# Patient Record
Sex: Male | Born: 1990 | Race: White | Hispanic: No | Marital: Single | Smoking: Never smoker
Health system: Southern US, Community
[De-identification: ages and names within clinical notes are randomized; demographics above are authoritative.]

---

## 2011-09-27 ENCOUNTER — Emergency Department (HOSPITAL_COMMUNITY): Payer: Managed Care, Other (non HMO)

## 2011-09-27 ENCOUNTER — Encounter (HOSPITAL_COMMUNITY): Payer: Self-pay | Admitting: *Deleted

## 2011-09-27 ENCOUNTER — Emergency Department (HOSPITAL_COMMUNITY)
Admission: EM | Admit: 2011-09-27 | Discharge: 2011-09-27 | Disposition: A | Discharge status: Home / Self Care | Payer: Managed Care, Other (non HMO) | Attending: Emergency Medicine | Admitting: Emergency Medicine

## 2011-09-27 DIAGNOSIS — R0602 Shortness of breath: Secondary | ICD-10-CM | POA: Insufficient documentation

## 2011-09-27 DIAGNOSIS — R05 Cough: Secondary | ICD-10-CM | POA: Insufficient documentation

## 2011-09-27 DIAGNOSIS — R059 Cough, unspecified: Secondary | ICD-10-CM | POA: Insufficient documentation

## 2011-09-27 NOTE — ED Provider Notes (Signed)
History     CSN: [id number]  Arrival date & time 09/27/11  1214   None     Chief Complaint  Patient presents with  . Cough    (Consider location/radiation/quality/duration/timing/severity/associated sxs/prior treatment) HPI Comments: States he works in a [employer] and that the air if frequently "dirty and dusty".   Since yest he has felt SOB.  He has a mild NP cough.  No fever or chills.  Patient is a 21 y.o. male presenting with cough. The history is provided by the patient.  Cough This is a new problem. The current episode started yesterday. The problem occurs constantly. The problem has not changed since onset.The cough is non-productive. There has been no fever. Associated symptoms include shortness of breath. Pertinent negatives include no chest pain, no chills and no wheezing. He has tried nothing for the symptoms. He is not a smoker. His past medical history does not include bronchitis, pneumonia, bronchiectasis, COPD, emphysema or asthma.    History reviewed. No pertinent past medical history.  History reviewed. No pertinent past surgical history.  Family History  Problem Relation Age of Onset  . Diabetes Mother     History  Substance Use Topics  . Smoking status: Never Smoker   . Smokeless tobacco: Not on file  . Alcohol Use: Yes      Review of Systems  Constitutional: Negative for fever and chills.  Respiratory: Positive for cough and shortness of breath. Negative for chest tightness, wheezing and stridor.   Cardiovascular: Negative for chest pain.  Gastrointestinal: Negative for nausea and vomiting.  All other systems reviewed and are negative.    Allergies  Review of patient's allergies indicates no known allergies.  Home Medications  No current outpatient prescriptions on file.  BP 125/79  Pulse 80  Temp(Src) 97.8 F (36.6 C) (Oral)  Resp 20  Ht 6' (1.829 m)  Wt 195 lb (88.451 kg)  BMI 26.45 kg/m2  SpO2 100%  Physical Exam    Nursing note and vitals reviewed. Constitutional: He is oriented to person, place, and time. He appears well-developed and well-nourished.  HENT:  Head: Normocephalic and atraumatic.  Eyes: EOM are normal.  Neck: Normal range of motion.  Cardiovascular: Normal rate, regular rhythm, normal heart sounds and intact distal pulses.   Pulmonary/Chest: Effort normal and breath sounds normal. No accessory muscle usage. Not tachypneic. No respiratory distress. He has no decreased breath sounds. He has no wheezes. He has no rhonchi. He has no rales. He exhibits no tenderness.  Abdominal: Soft. He exhibits no distension. There is no tenderness.  Musculoskeletal: Normal range of motion.  Neurological: He is alert and oriented to person, place, and time.  Skin: Skin is warm and dry.  Psychiatric: He has a normal mood and affect. Judgment normal.    ED Course  Procedures (including critical care time)  Labs Reviewed - No data to display Dg Chest 2 View  09/27/2011  *RADIOLOGY REPORT*  Clinical Data: Cough, short of breath  CHEST - 2 VIEW  Comparison: None available  Findings: Normal mediastinum and cardiac silhouette.  Normal pulmonary  vasculature.  No evidence of effusion, infiltrate, or pneumothorax.  No acute bony abnormality.  IMPRESSION: No acute cardiopulmonary process.  Original Report Authenticated By: Genevive Bi, M.D.     1. Shortness of breath       MDM  Normal CXR.  SO2 100%.  Wear mask at work.  Return if sxs worsen or change significantly.  Worthy Rancher, PA 09/27/11 1425

## 2011-09-27 NOTE — ED Provider Notes (Signed)
Medical screening examination/treatment/procedure(s) were performed by non-physician practitioner and as supervising physician I was immediately available for consultation/collaboration. Devoria Albe, MD, Armando Gang   Ward Givens, MD 09/27/11 [phone]

## 2011-09-27 NOTE — Discharge Instructions (Signed)
Shortness of Breath Shortness of breath (dyspnea) is the feeling of uneasy breathing. Shortness of breath does not always mean that there is a life-threatening illness. However, shortness of breath requires immediate medical care. CAUSES  Causes for shortness of breath include:  Not enough oxygen in the air (as with high altitudes or with a smoke-filled room).   Short-term (acute) lung disease, including:   Infections such as pneumonia.   Fluid in the lungs, such as heart failure.   A blood clot in the lungs (pulmonary embolism).   Lasting (chronic) lung diseases.   Heart disease (heart attack, angina, heart failure, and others).   Low red blood cells (anemia).   Poor physical fitness. This can cause shortness of breath when you exercise.   Chest or back injuries or stiffness.   Being overweight (obese).   Anxiety. This can make you feel like you are not getting enough air.  DIAGNOSIS  Serious medical problems can usually be found during your physical exam. Many tests may also be done to determine why you are having shortness of breath. Tests include:  Chest X-rays.   Lung function tests.   Blood tests.   Electrocardiography.   Exercise testing.   A cardiac echo.   Imaging scans.  Your caregiver may not be able to find a cause for your shortness of breath after your exam. In this case, it is important to have a follow-up exam with your caregiver as directed.  HOME CARE INSTRUCTIONS   Do not smoke. Smoking is a common cause of shortness of breath. Ask for help to stop smoking.   Avoid being around chemicals that may bother your breathing (paint fumes, dust).   Rest as needed. Slowly resume your usual activities.   If medicines were prescribed, take them as directed for the full length of time directed. This includes oxygen and any inhaled medicines.   Follow up with your caregiver as directed. Waiting to do so or failure to follow up could result in worsening of  your condition and possible disability or death.   Be sure you understand what to do or who to call if your shortness of breath worsens.  SEEK MEDICAL CARE IF:   Your condition does not improve in the time expected.   You have a hard time doing your normal activities even with rest.   You have any side effects or problems with the medicines prescribed.   You develop any new symptoms.  SEEK IMMEDIATE MEDICAL CARE IF:   Your shortness of breath is getting worse.   You feel lightheaded, faint, or develop a cough not controlled with medicines.   You start coughing up blood.   You have pain with breathing.   You have chest pain or pain in your arms, shoulders, or abdomen.   You have a fever.   You are unable to walk up stairs or exercise the way you normally do.   Your symptoms are getting worse.  Document Released: 02/02/2001 Document Revised: 04/29/2011 Document Reviewed: 09/20/2007 [name] Patient Information 2012 [place], [place].   The chest x-ray is normal.  Your vitals reveal normal lung function.  Return to the ED if your symptoms worsen or change significantly.

## 2011-09-27 NOTE — ED Notes (Signed)
Coughing for over 1 year, now feels he can only take "half a breath".  Non productive cough, No fever.

## 2011-09-27 NOTE — ED Notes (Signed)
Pt DC to home with steady gait 

## 2020-02-20 ENCOUNTER — Other Ambulatory Visit: Payer: Self-pay

## 2020-02-20 ENCOUNTER — Emergency Department (HOSPITAL_COMMUNITY)
Admission: EM | Admit: 2020-02-20 | Discharge: 2020-02-20 | Disposition: A | Discharge status: Home / Self Care | Payer: PRIVATE HEALTH INSURANCE | Attending: Emergency Medicine | Admitting: Emergency Medicine

## 2020-02-20 ENCOUNTER — Emergency Department (HOSPITAL_COMMUNITY): Payer: PRIVATE HEALTH INSURANCE

## 2020-02-20 ENCOUNTER — Encounter (HOSPITAL_COMMUNITY): Payer: Self-pay

## 2020-02-20 DIAGNOSIS — W230XXA Caught, crushed, jammed, or pinched between moving objects, initial encounter: Secondary | ICD-10-CM | POA: Insufficient documentation

## 2020-02-20 DIAGNOSIS — Y990 Civilian activity done for income or pay: Secondary | ICD-10-CM | POA: Insufficient documentation

## 2020-02-20 DIAGNOSIS — S61212A Laceration without foreign body of right middle finger without damage to nail, initial encounter: Secondary | ICD-10-CM | POA: Insufficient documentation

## 2020-02-20 DIAGNOSIS — Y9269 Other specified industrial and construction area as the place of occurrence of the external cause: Secondary | ICD-10-CM | POA: Insufficient documentation

## 2020-02-20 DIAGNOSIS — Y93H9 Activity, other involving exterior property and land maintenance, building and construction: Secondary | ICD-10-CM | POA: Diagnosis not present

## 2020-02-20 MED ORDER — BUPIVACAINE HCL (PF) 0.5 % IJ SOLN
10.0000 mL | Freq: Once | INTRAMUSCULAR | Status: AC
  Administered 2020-02-20: 10 mL
  Filled 2020-02-20: qty 10

## 2020-02-20 NOTE — ED Triage Notes (Signed)
Patient here with right hand middle finger laceration to tip of finger after slicing on metal, nailbed intact.

## 2020-02-20 NOTE — ED Provider Notes (Signed)
[name] EMERGENCY DEPARTMENT Provider Note   CSN: [id number] Arrival date & time: 02/20/20  1035     History No chief complaint on file.   James Huerta is a 29 y.o. male.  Patient arrives from work after suffering injury to right hand. Patient reports that his right middle finger was crushed between two metal plates, resulting in laceration to the distal tip of the finger. Nail bed appears intact.  The history is provided by the patient. No language interpreter was used.  Laceration Location:  Finger Finger laceration location:  R middle finger Quality: avulsion   Bleeding: controlled   Injury mechanism: crush injury. Pain details:    Quality:  Throbbing   Severity:  Mild Foreign body present:  No foreign bodies Tetanus status:  Up to date      History reviewed. No pertinent past medical history.  There are no problems to display for this patient.   History reviewed. No pertinent surgical history.     Family History  Problem Relation Age of Onset  . Diabetes Mother     Social History   Tobacco Use  . Smoking status: Never Smoker  Substance Use Topics  . Alcohol use: Yes  . Drug use: No    Home Medications Prior to Admission medications   Not on File    Allergies    Patient has no known allergies.  Review of Systems   Review of Systems  Skin: Positive for wound.  All other systems reviewed and are negative.   Physical Exam Updated Vital Signs BP 128/85 (BP Location: Left Arm)   Pulse 76   Temp 98.2 F (36.8 C) (Oral)   Resp 16   Ht 6' (1.829 m)   Wt 83.9 kg   SpO2 100%   BMI 25.09 kg/m   Physical Exam Vitals and nursing note reviewed.  Constitutional:      Appearance: He is well-developed.  HENT:     Head: Normocephalic and atraumatic.     Nose: Nose normal.     Mouth/Throat:     Mouth: Mucous membranes are moist.  Eyes:     Conjunctiva/sclera: Conjunctivae normal.  Cardiovascular:     Rate and  Rhythm: Normal rate and regular rhythm.     Heart sounds: No murmur heard.   Pulmonary:     Effort: Pulmonary effort is normal. No respiratory distress.     Breath sounds: Normal breath sounds.  Abdominal:     Palpations: Abdomen is soft.     Tenderness: There is no abdominal tenderness.  Musculoskeletal:     Cervical back: Neck supple.  Skin:    General: Skin is warm and dry.  Neurological:     Mental Status: He is alert and oriented to person, place, and time.  Psychiatric:        Mood and Affect: Mood normal.     ED Results / Procedures / Treatments   Labs (all labs ordered are listed, but only abnormal results are displayed) Labs Reviewed - No data to display  EKG None  Radiology DG Finger Middle Right  Result Date: 02/20/2020 CLINICAL DATA:  Crush injury to the third digit EXAM: RIGHT MIDDLE FINGER 2+V COMPARISON:  None. FINDINGS: Soft tissue defect is noted consistent with the recent injury. No underlying fracture is seen. No foreign body is noted. IMPRESSION: Soft tissue defect without acute bony abnormality. Electronically Signed   By: Alcide Clever M.D.   On: 02/20/2020 13:30  Procedures .Marland KitchenLaceration Repair  Date/Time: 02/20/2020 4:16 PM Performed by: Felicie Morn, NP Authorized by: Felicie Morn, NP   Consent:    Consent obtained:  Verbal   Consent given by:  Patient   Risks discussed:  Infection, pain and poor cosmetic result Anesthesia (see MAR for exact dosages):    Anesthesia method:  Local infiltration   Local anesthetic:  Bupivacaine 0.5% w/o epi Laceration details:    Location:  Finger   Finger location:  R long finger   Length (cm):  1.2 Repair type:    Repair type:  Simple Pre-procedure details:    Preparation:  Patient was prepped and draped in usual sterile fashion Exploration:    Wound exploration: entire depth of wound probed and visualized     Contaminated: no   Treatment:    Area cleansed with:  Betadine and saline   Amount of  cleaning:  Extensive   Irrigation solution:  Sterile saline   Irrigation method:  Syringe Skin repair:    Repair method:  Sutures   Suture size:  5-0   Suture material:  Fast-absorbing gut   Suture technique:  Simple interrupted   Number of sutures:  7 Post-procedure details:    Dressing:  Sterile dressing   Patient tolerance of procedure:  Tolerated well, no immediate complications   (including critical care time)  Medications Ordered in ED Medications  bupivacaine (MARCAINE) 0.5 % injection 10 mL (10 mLs Infiltration Given by Other 02/20/20 1421)    ED Course  I have reviewed the triage vital signs and the nursing notes.  Pertinent labs & imaging results that were available during my care of the patient were reviewed by me and considered in my medical decision making (see chart for details).    MDM Rules/Calculators/A&P                          Tetanus {UTD. Laceration occurred < 12 hours prior to repair. Discussed laceration care with pt and answered questions. Wound closed with absorbable sutures. Patient to follow-up should there be signs of dehiscence or infection. Pt is hemodynamically stable with no complaints prior to dc.   Final Clinical Impression(s) / ED Diagnoses Final diagnoses:  Laceration of right middle finger without foreign body without damage to nail, initial encounter    Rx / DC Orders ED Discharge Orders    None       Felicie Morn, NP 02/20/20 2022    Cathren Laine, MD 02/21/20 1623

## 2020-02-20 NOTE — Discharge Instructions (Signed)
Your wound was closed with absorbable sutures. They will dissolve on their own. Please keep wound clean and dry. Monitor for signs of infection. You may follow-up with your primary care provider as needed.

## 2020-12-16 ENCOUNTER — Other Ambulatory Visit (HOSPITAL_COMMUNITY): Payer: Self-pay | Admitting: Family Medicine

## 2020-12-16 DIAGNOSIS — M542 Cervicalgia: Secondary | ICD-10-CM

## 2020-12-16 DIAGNOSIS — R599 Enlarged lymph nodes, unspecified: Secondary | ICD-10-CM

## 2020-12-23 ENCOUNTER — Other Ambulatory Visit: Payer: Self-pay

## 2020-12-23 ENCOUNTER — Ambulatory Visit (HOSPITAL_COMMUNITY)
Admission: RE | Admit: 2020-12-23 | Discharge: 2020-12-23 | Disposition: A | Discharge status: Home / Self Care | Payer: No Typology Code available for payment source | Source: Ambulatory Visit | Attending: Family Medicine | Admitting: Family Medicine

## 2020-12-23 DIAGNOSIS — M542 Cervicalgia: Secondary | ICD-10-CM | POA: Insufficient documentation

## 2020-12-23 DIAGNOSIS — R599 Enlarged lymph nodes, unspecified: Secondary | ICD-10-CM | POA: Insufficient documentation

## 2021-09-16 IMAGING — US US SOFT TISSUE HEAD/NECK
1 series · 14 of 18 positions shown · non-contrast
Comparison: None.

CLINICAL DATA: Left neck pain, palpable lymph nodes

EXAM:
ULTRASOUND OF HEAD/NECK SOFT TISSUES
TECHNIQUE: Ultrasound examination of the head and neck soft tissues was
performed in the area of clinical concern.

[Series 1: us soft tissue head/neck · 0.06mm/px · 18 acquisitions, 14 frames shown]
[im 1/18]
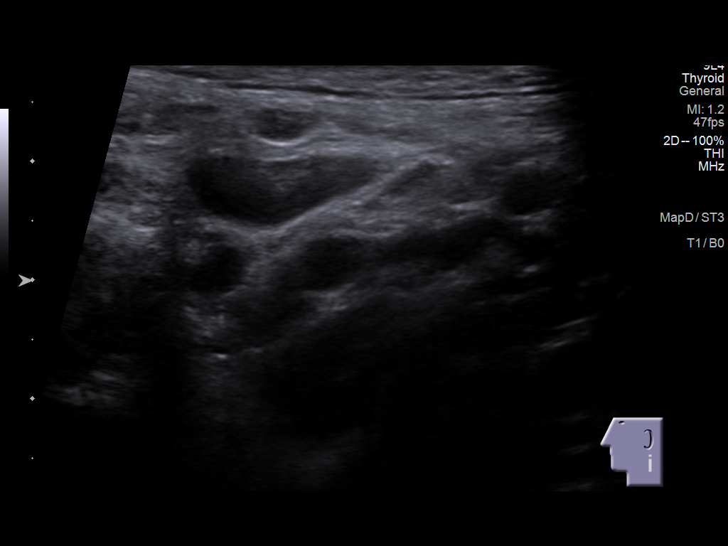
[im 2/18]
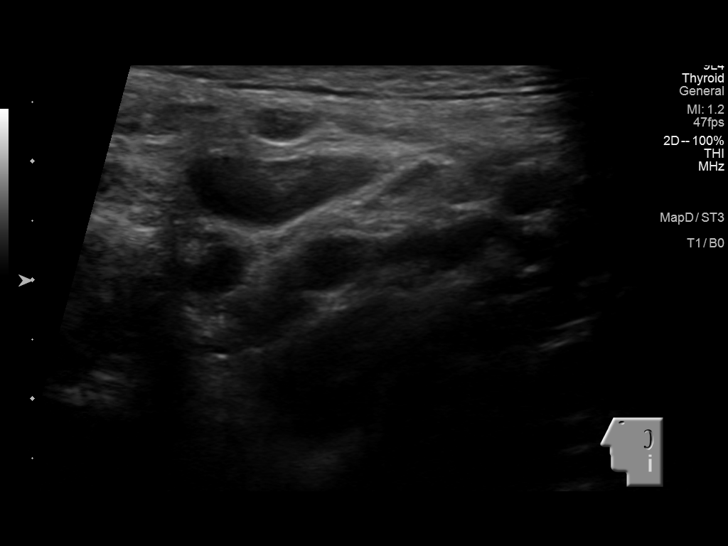
[im 4/18]
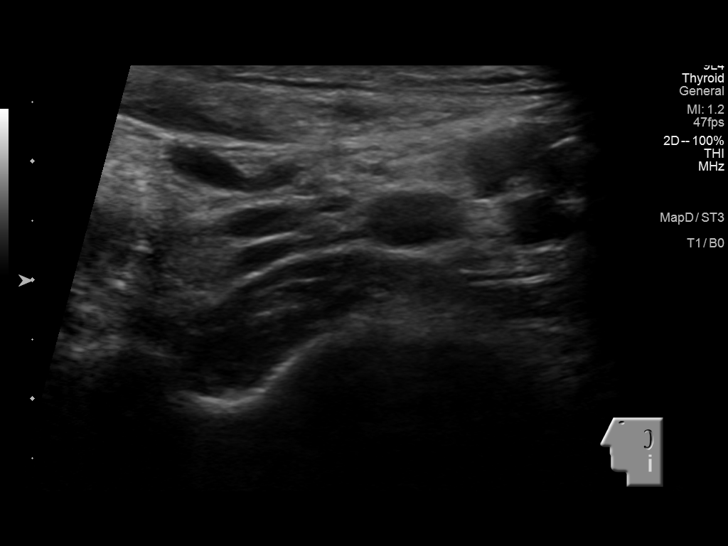
[im 5/18]
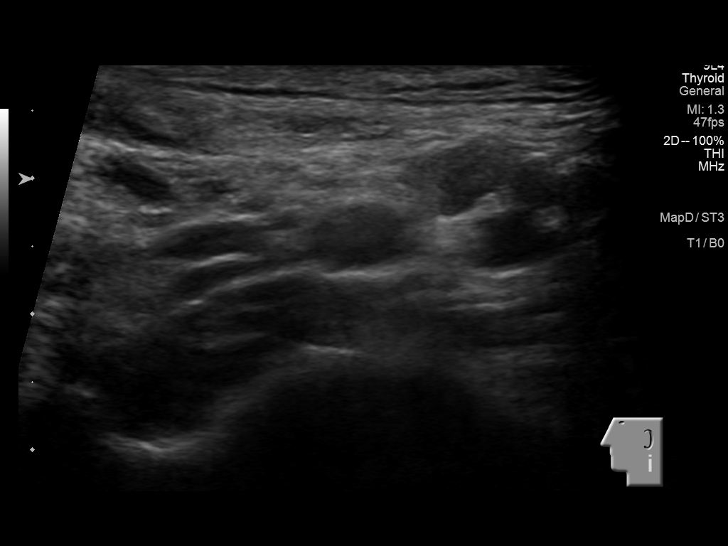
[im 6/18]
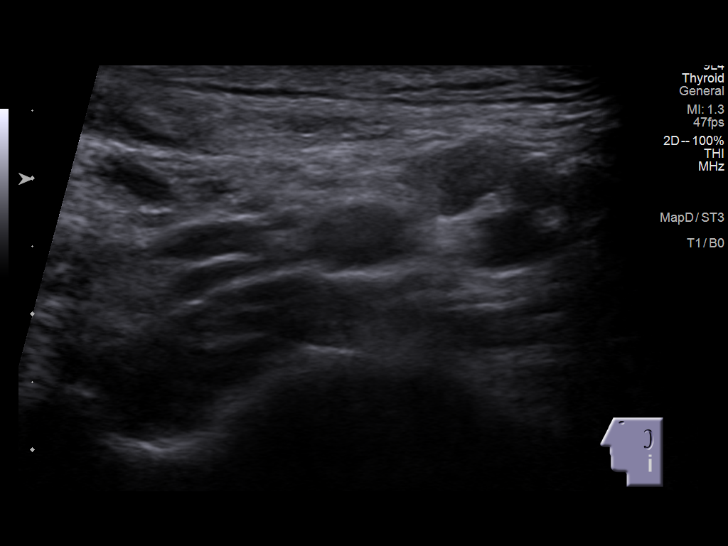
[im 8/18]
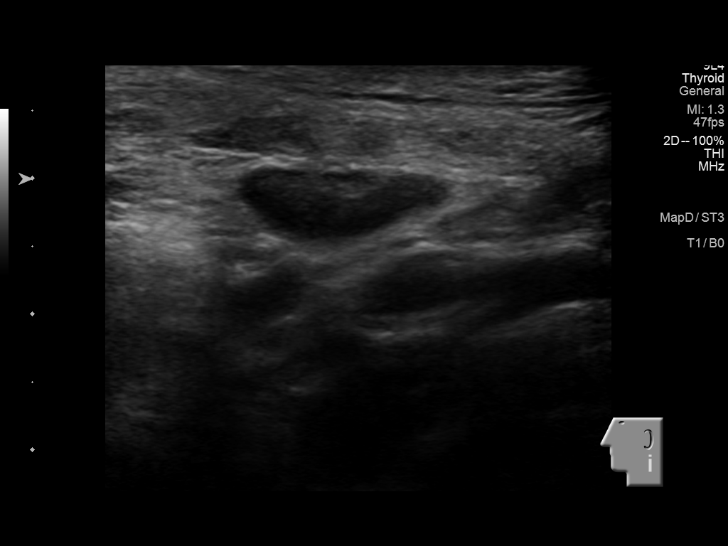
[im 9/18]
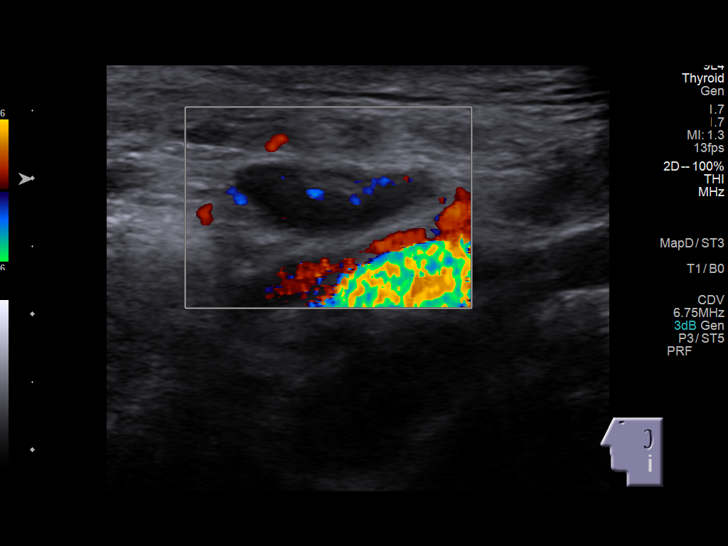
[im 10/18]
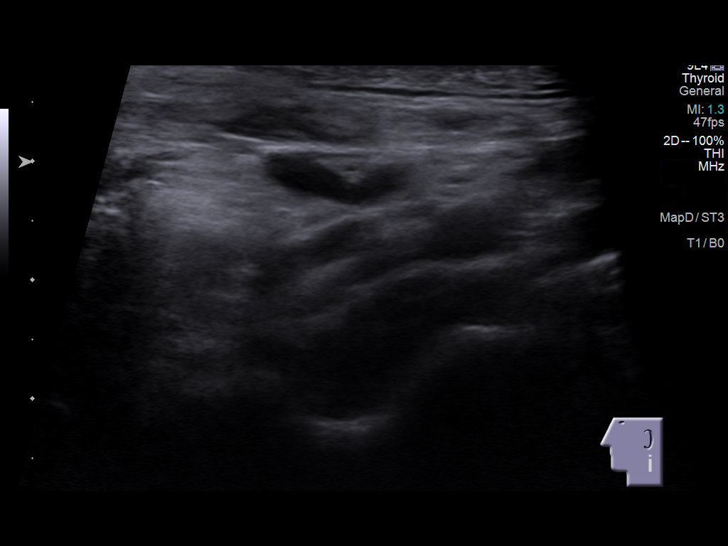
[im 11/18]
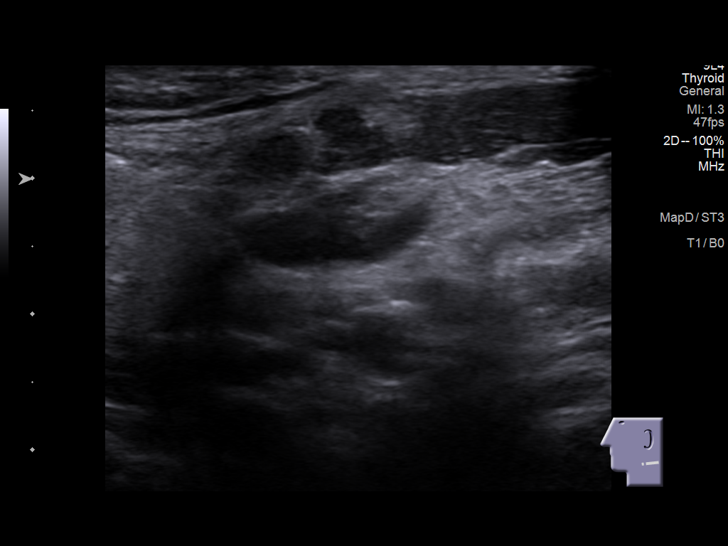
[im 13/18]
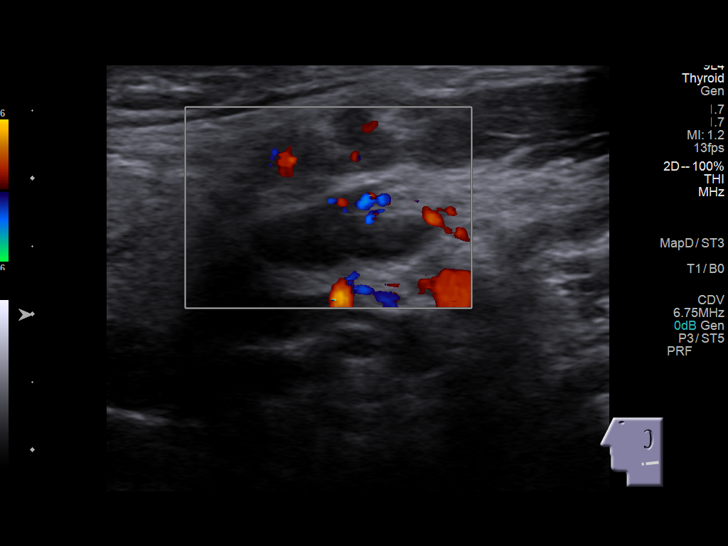
[im 14/18]
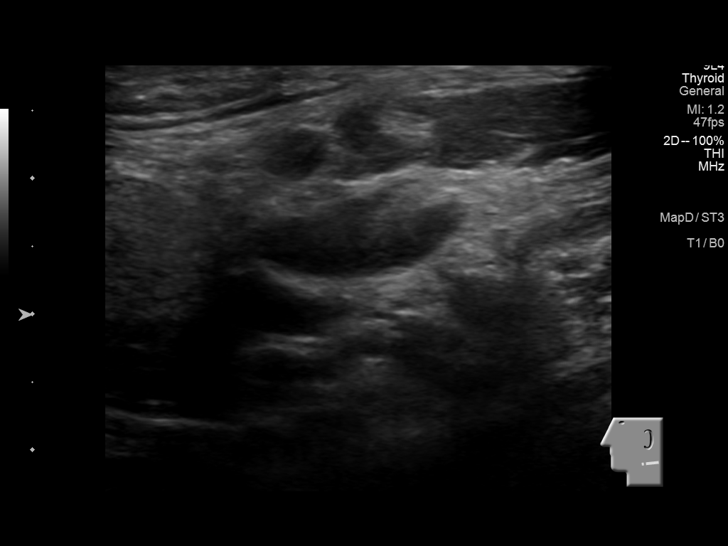
[im 15/18]
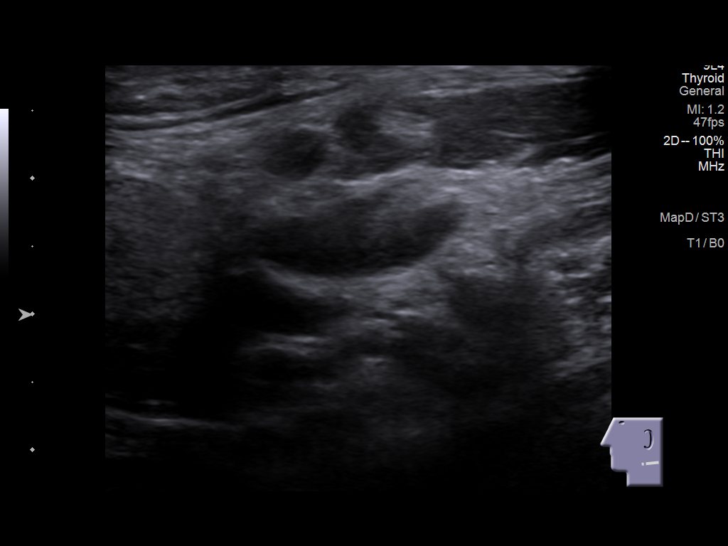
[im 17/18]
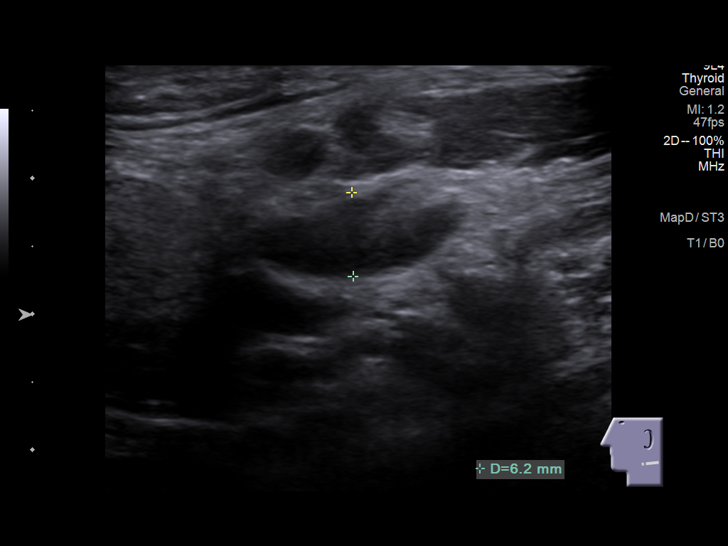
[im 18/18]
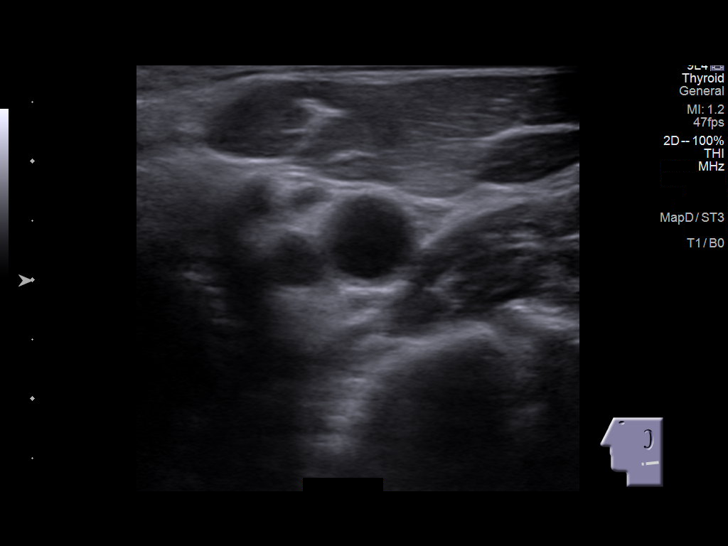

[14 of 18 positions shown; findings below may reference images not displayed]

FINDINGS: Ultrasound performed of the left lateral neck area of concern. In
this region, there is a benign mildly prominent left neck cervical
lymph node with preserved architecture and measures 6 mm in short
axis. No other regional soft tissue abnormality, cyst, fluid
collection, or bulky adenopathy.
IMPRESSION: Left neck area of concern/palpable abnormality correlates with a
nonspecific mildly prominent benign-appearing lymph node.

## 2021-12-24 ENCOUNTER — Other Ambulatory Visit (HOSPITAL_COMMUNITY): Payer: Self-pay | Admitting: Internal Medicine

## 2021-12-24 ENCOUNTER — Ambulatory Visit (HOSPITAL_COMMUNITY)
Admission: RE | Admit: 2021-12-24 | Discharge: 2021-12-24 | Disposition: A | Discharge status: Home / Self Care | Payer: 59 | Source: Ambulatory Visit | Attending: Internal Medicine | Admitting: Internal Medicine

## 2021-12-24 DIAGNOSIS — M542 Cervicalgia: Secondary | ICD-10-CM | POA: Insufficient documentation

## 2022-03-01 ENCOUNTER — Ambulatory Visit (HOSPITAL_COMMUNITY): Payer: 59 | Attending: Internal Medicine

## 2022-03-01 DIAGNOSIS — M542 Cervicalgia: Secondary | ICD-10-CM | POA: Diagnosis not present

## 2022-03-01 NOTE — Therapy (Signed)
OUTPATIENT PHYSICAL THERAPY CERVICAL EVALUATION   Patient Name: James Huerta MRN: [id number] DOB:1991-03-10, 31 y.o., male Today's Date: 03/01/2022   PT End of Session - 03/01/22 1032     Visit Number 1    Number of Visits 8    Date for PT Re-Evaluation 03/29/22    Authorization Type [employer]; no copay, no auth required    Authorization - Number of Visits 60    PT Start Time 1030    PT Stop Time 1113    PT Time Calculation (min) 43 min             No past medical history on file. No past surgical history on file. There are no problems to display for this patient.   PCP: [facility]  REFERRING PROVIDER: PT eval/tx for neck pain on left side per Hoyt Koch, MD   REFERRING DIAG: PT eval/tx for neck pain on left side per Hoyt Koch, MD   THERAPY DIAG:  Neck pain  Rationale for Evaluation and Treatment Rehabilitation  ONSET DATE: 5 years  SUBJECTIVE:                                                                                                                                                                                                         SUBJECTIVE STATEMENT: Started with getting stiff in his upper neck early in the morning several years ago.  Mostly seems more stiff in the mornings but has burning pain all day.  Fusco referred to [facility], [facility]. Had a MRI; awaiting results; sees MD on 10/13 for results. Also reports some jaw popping occasionally on left side; burning just under skull on both sides but worse on the left; and right side upper trap tightness.0  Goes by "[name]"    PERTINENT HISTORY:  No surgical history or injury  PAIN:  Are you having pain? Yes: NPRS scale: 3/10 Pain location: left side under skull; right side upper trap Pain description: burning under the skull Aggravating factors: movement Relieving factors: rest; mild relief with muscle relaxers but no lasting relief  PRECAUTIONS: None  WEIGHT  BEARING RESTRICTIONS No  FALLS:  Has patient fallen in last 6 months? 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  FINDINGS: No recent fracture is seen. Alignment of posterior margin of vertebral bodies is unremarkable. Degenerative changes are noted with encroachment of neural foramina from C3-C7 levels, more severe at C5-C6 level. Prevertebral soft tissues are unremarkable.   IMPRESSION: No recent fracture is seen in cervical spine. Cervical spondylosis with encroachment of neural foramina from C3-C7 levels, more severe at C5-C6 level.     Electronically Signed   By: Ernie Avena M.D.   On: 12/24/2021 16:50  PATIENT SURVEYS:  FOTO 69   COGNITION: Overall cognitive status: Within functional limits for tasks assessed   SENSATION: WFL  POSTURE:  decreased cervical spine curvature; very straight  PALPATION: Mild tightness right side upper trap and paraspinals and periscapular structure.    CERVICAL ROM:   Active ROM AROM (deg) eval  Flexion 36  Extension 40  Right lateral flexion 45  Left lateral flexion 36  Right rotation 53  Left rotation 84   (Blank rows = not tested)   UPPER EXTREMITY MMT:  MMT Right eval Left eval  Shoulder flexion 5 5  Shoulder extension    Shoulder abduction 5 5  Shoulder adduction    Shoulder extension    Shoulder internal rotation    Shoulder external rotation    Middle trapezius 5 5  Lower trapezius    Elbow flexion 5 5  Elbow extension    Wrist flexion    Wrist extension    Wrist ulnar deviation    Wrist radial deviation    Wrist pronation    Wrist supination    Grip strength     (Blank rows = not tested)  CERVICAL SPECIAL  TESTS:  Distraction test: Negative   FUNCTIONAL TESTS:  5 times sit to stand: 12.59 sec   TODAY'S TREATMENT:  Physical therapy evaluation and HEP instruction, manual cervical traction   PATIENT EDUCATION:  Education details: Patient educated on exam findings, POC, scope of PT, HEP. Person educated: Patient Education method: Explanation, Demonstration, and Handouts Education comprehension: verbalized understanding, returned demonstration, verbal cues required, and tactile cues required    HOME EXERCISE PROGRAM: Access Code: [email or web address] URL: [email or web address] Date: 03/01/2022 Prepared by: [facility] - Rehab  Exercises - Supine Chin Tuck  - 2 x daily - 7 x weekly - 1 sets - 10 reps - Supine Scapular Retraction  - 1 x daily - 7 x weekly - 1 sets - 10 reps   ASSESSMENT:  CLINICAL IMPRESSION: Patient is a 31 y.o. male who was seen today for physical therapy evaluation and treatment for chronic neck pain. Patient presents on evaluation with limited neck mobility, neck pain, tightness of soft tissue structures of the cervical spine that all negatively impact his ability to work, and turn his head to scan his environment with walking, driving.  Patient will benefit from continued skilled therapy services to address deficits and promote return to optimal function.       OBJECTIVE IMPAIRMENTS decreased activity tolerance, decreased endurance, decreased knowledge of condition, decreased mobility, decreased ROM, hypomobility, increased edema, increased fascial restrictions, impaired perceived functional ability, increased muscle spasms, impaired flexibility, postural dysfunction, and pain.   ACTIVITY LIMITATIONS bending, sleeping, reach over head, and hygiene/grooming  PARTICIPATION LIMITATIONS: driving and occupation   REHAB POTENTIAL: Good  CLINICAL DECISION MAKING: Evolving/moderate complexity  EVALUATION COMPLEXITY: Moderate   GOALS: Goals reviewed with patient?  No  SHORT TERM GOALS: Target date: 03/15/2022    patient will be independent with initial HEP  Baseline:  Goal status: INITIAL  2.  Patient will report at least 50% improvement in overall symptoms and/or function to demonstrate improved functional mobility  Baseline:  Goal status: INITIAL    LONG TERM GOALS: Target date: 03/29/2022  Patient will report at least 75% improvement in overall symptoms and/or function to demonstrate improved functional mobility  Baseline:  Goal status: INITIAL  2.  Patient will improve FOTO score to predicted value to demonstrate improved functional mobility  Baseline: 69 Goal status: INITIAL  3.  Patient will improve right cervical rotation by 15 degrees to Improve ability to scan his environment to drive safely Baseline: 53 Goal status: INITIAL  4.  Patient will be able to work all day without pain greater than 2/10 to improve work efficiency. Baseline: 3/10 Goal status: INITIAL    PLAN: PT FREQUENCY: 2x/week  PT DURATION: 4 weeks  PLANNED INTERVENTIONS: Therapeutic exercises, Therapeutic activity, Neuromuscular re-education, Balance training, Gait training, Patient/Family education, Joint manipulation, Joint mobilization, Stair training, Orthotic/Fit training, DME instructions, Aquatic Therapy, Dry Needling, Electrical stimulation, Spinal manipulation, Spinal mobilization, Cryotherapy, Moist heat, Compression bandaging, scar mobilization, Splintting, Taping, Traction, Ultrasound, Ionotophoresis 4mg /ml Dexamethasone, and Manual therapy  PLAN FOR NEXT SESSION: Review of HEP and goals; progress cervical mobility and strengthening as able; manual as needed   11:16 AM, 03/01/22 Laityn Bensen Small Onnika Siebel MPT [facility] [place] [phone] [phone]

## 2022-03-19 ENCOUNTER — Ambulatory Visit (HOSPITAL_COMMUNITY): Payer: 59

## 2022-03-19 DIAGNOSIS — M542 Cervicalgia: Secondary | ICD-10-CM | POA: Diagnosis not present

## 2022-03-19 NOTE — Therapy (Signed)
OUTPATIENT PHYSICAL THERAPY CERVICAL TREATMENT   Patient Name: James Huerta MRN: [id number] DOB:08/12/1990, 31 y.o., male Today's Date: 03/19/2022   PT End of Session - 03/19/22 1115     Visit Number 2    Number of Visits 8    Date for PT Re-Evaluation 03/29/22    Authorization Type UHC; no copay, no auth required    Authorization - Number of Visits 60    PT Start Time 1116    PT Stop Time 1200    PT Time Calculation (min) 44 min              No past medical history on file. No past surgical history on file. There are no problems to display for this patient.   [facility]P: [facility]  REFERRING PROVIDER: PT eval/tx for neck pain on left side per Duffy Rhody, MD   REFERRING DIAG: PT eval/tx for neck pain on left side per Duffy Rhody, MD   THERAPY DIAG:  Neck pain  Rationale for Evaluation and Treatment Rehabilitation  ONSET DATE: 5 years  SUBJECTIVE:                                                                                                                                                                                                         SUBJECTIVE STATEMENT: Shoulders have loosened up but pain up under the skull about the same; MRI shows 2 bulging discs in the neck; MD would like him to try dry needling; returns to MD 12/8   Eval:Started with getting stiff in his upper neck early in the morning several years ago.  Mostly seems more stiff in the mornings but has burning pain all day.  [place] referred to [facility], Chief of Staff. Had a MRI; awaiting results; sees MD on 10/13 for results. Also reports some jaw popping occasionally on left side; burning just under skull on both sides but worse on the left; and right side upper trap tightness.0  Goes by "James Huerta"    PERTINENT HISTORY:  No surgical history or injury  PAIN:  Are you having pain? Yes: NPRS scale: 3/10 Pain location: left side under skull; right side upper trap Pain  description: burning under the skull Aggravating factors: movement Relieving factors: rest; mild relief with muscle relaxers but no lasting relief  PRECAUTIONS: None  WEIGHT BEARING RESTRICTIONS No  FALLS:  Has patient fallen in last 6 months? No  OCCUPATION: was a Furniture conservator/restorer for a long time; now working on a packing machine; mostly sitting  PLOF: [place] not hurt  and have full range of motion in my neck  OBJECTIVE:   DIAGNOSTIC FINDINGS:  CLINICAL DATA:  Neck pain   EXAM: CERVICAL SPINE - COMPLETE 4+ VIEW   COMPARISON:  None Available.   FINDINGS: No recent fracture is seen. Alignment of posterior margin of vertebral bodies is unremarkable. Degenerative changes are noted with encroachment of neural foramina from C3-C7 levels, more severe at C5-C6 level. Prevertebral soft tissues are unremarkable.   IMPRESSION: No recent fracture is seen in cervical spine. Cervical spondylosis with encroachment of neural foramina from C3-C7 levels, more severe at C5-C6 level.     Electronically Signed   By: Elmer Picker M.D.   On: 12/24/2021 16:50  PATIENT SURVEYS:  FOTO 69   COGNITION: Overall cognitive status: Within functional limits for tasks assessed   SENSATION: WFL  POSTURE:  decreased cervical spine curvature; very straight  PALPATION: Mild tightness right side upper trap and paraspinals and periscapular structure.    CERVICAL ROM:   Active ROM AROM (deg) eval  Flexion 36  Extension 40  Right lateral flexion 45  Left lateral flexion 36  Right rotation 53  Left rotation 84   (Blank rows = not tested)   UPPER EXTREMITY MMT:  MMT Right eval Left eval  Shoulder flexion 5 5  Shoulder extension    Shoulder abduction 5 5  Shoulder adduction    Shoulder extension    Shoulder internal rotation    Shoulder external rotation    Middle trapezius 5 5  Lower trapezius    Elbow flexion 5 5  Elbow extension    Wrist flexion     Wrist extension    Wrist ulnar deviation    Wrist radial deviation    Wrist pronation    Wrist supination    Grip strength     (Blank rows = not tested)  CERVICAL SPECIAL TESTS:  Distraction test: Negative   FUNCTIONAL TESTS:  5 times sit to stand: 12.59 sec   TODAY'S TREATMENT:  03/19/22 Review of HEP and goals Supine: Cervical retractions x 10 Scapular retractions 3" hold x 10 Supine MacKenzie cervical retraction and extension with rotation at end range x 5  Manual STM and cervical traction x 12' to decrease pain and increase soft tissue and joint mobility; no other interventions performed during manual treatment      Eval:Physical therapy evaluation and HEP instruction, manual cervical traction   PATIENT EDUCATION:  Education details: Patient educated on exam findings, POC, scope of PT, HEP. Person educated: Patient Education method: Explanation, Demonstration, and Handouts Education comprehension: verbalized understanding, returned demonstration, verbal cues required, and tactile cues required    HOME EXERCISE PROGRAM: Access Code: [email or web address] URL: [email or web address] Date: 03/01/2022 Prepared by: AP - Rehab  Exercises - Supine Chin Tuck  - 2 x daily - 7 x weekly - 1 sets - 10 reps - Supine Scapular Retraction  - 1 x daily - 7 x weekly - 1 sets - 10 reps   ASSESSMENT:  CLINICAL IMPRESSION: Today's session began with a review of HEP and goals.  Patient verbalizes agreement with set rehab goals.  Trial of progressed cervical extension in supine with minimal change in symptoms; manual STM and traction to decrease pain and increase soft tissue and joint mobility and again patient reports minimal change. Encourage patient to perform HEP more frequently. Patient interested in dry needling; issued patient instructions and scheduled with appropriate therapist next visit.   Patient will benefit from continued skilled therapy services to  address  deficits and promote return to optimal function.       OBJECTIVE IMPAIRMENTS decreased activity tolerance, decreased endurance, decreased knowledge of condition, decreased mobility, decreased ROM, hypomobility, increased edema, increased fascial restrictions, impaired perceived functional ability, increased muscle spasms, impaired flexibility, postural dysfunction, and pain.   ACTIVITY LIMITATIONS bending, sleeping, reach over head, and hygiene/grooming  PARTICIPATION LIMITATIONS: driving and occupation   REHAB POTENTIAL: Good  CLINICAL DECISION MAKING: Evolving/moderate complexity  EVALUATION COMPLEXITY: Moderate   GOALS: Goals reviewed with patient? No  SHORT TERM GOALS: Target date: 03/15/2022    patient will be independent with initial HEP  Baseline:  Goal status: MET  2.  Patient will report at least 50% improvement in overall symptoms and/or function to demonstrate improved functional mobility  Baseline:  Goal status: IN PROGRESS    LONG TERM GOALS: Target date: 03/29/2022  Patient will report at least 75% improvement in overall symptoms and/or function to demonstrate improved functional mobility  Baseline:  Goal status: IN PROGRESS  2.  Patient will improve FOTO score to predicted value to demonstrate improved functional mobility  Baseline: 69 Goal status: IN PROGRESS  3.  Patient will improve right cervical rotation by 15 degrees to Improve ability to scan his environment to drive safely Baseline: 53 Goal status: IN PROGRESS  4.  Patient will be able to work all day without pain greater than 2/10 to improve work efficiency. Baseline: 3/10 Goal status: IN PROGRESS    PLAN: PT FREQUENCY: 2x/week  PT DURATION: 4 weeks  PLANNED INTERVENTIONS: Therapeutic exercises, Therapeutic activity, Neuromuscular re-education, Balance training, Gait training, Patient/Family education, Joint manipulation, Joint mobilization, Stair training, Orthotic/Fit training,  DME instructions, Aquatic Therapy, Dry Needling, Electrical stimulation, Spinal manipulation, Spinal mobilization, Cryotherapy, Moist heat, Compression bandaging, scar mobilization, Splintting, Taping, Traction, Ultrasound, Ionotophoresis 24m/ml Dexamethasone, and Manual therapy  PLAN FOR NEXT SESSION: progress cervical mobility and strengthening as able; manual as needed; returns to MD 12/8; requests to try dry needling; dry needling instructions given today.   12:05 PM, 03/19/22 Aalaiyah Yassin Small Merilynn Haydu MPT [place] [facility] [place] #[phone]

## 2022-03-19 NOTE — Patient Instructions (Signed)

## 2022-03-26 ENCOUNTER — Encounter (HOSPITAL_COMMUNITY): Payer: Self-pay

## 2022-03-26 ENCOUNTER — Ambulatory Visit (HOSPITAL_COMMUNITY): Payer: 59 | Attending: Internal Medicine

## 2022-03-26 DIAGNOSIS — M542 Cervicalgia: Secondary | ICD-10-CM | POA: Diagnosis not present

## 2022-03-26 NOTE — Therapy (Signed)
OUTPATIENT PHYSICAL THERAPY CERVICAL TREATMENT   Patient Name: James Huerta MRN: [id number] DOB:Jan 14, 1991, 31 y.o., male Today's Date: 03/26/2022   PT End of Session - 03/26/22 1534     Visit Number 3    Number of Visits 8    Date for PT Re-Evaluation 03/29/22    Authorization Type UHC; no copay, no auth required    Authorization - Number of Visits 60    PT Start Time 1350    PT Stop Time 1428    PT Time Calculation (min) 38 min    Activity Tolerance Patient tolerated treatment well    Behavior During Therapy WFL for tasks assessed/performed               History reviewed. No pertinent past medical history. History reviewed. No pertinent surgical history. There are no problems to display for this patient.   PCP: [place], [place] PROVIDER: PT eval/tx for neck pain on left side per Duffy Rhody, MD  Next apt: end of November, beginning of December  REFERRING DIAG: PT eval/tx for neck pain on left side per Duffy Rhody, MD   THERAPY DIAG:  Neck pain  Rationale for Evaluation and Treatment Rehabilitation  ONSET DATE: 5 years  SUBJECTIVE:                                                                                                                                                                                                         SUBJECTIVE STATEMENT: Pt stated he has some burning up Lt side of neck under skull, constant.  Pt c/o jaw pops and is a relief.  Reports compliance with HEP 3-4 tunes a day.  Eval:Started with getting stiff in his upper neck early in the morning several years ago.  Mostly seems more stiff in the mornings but has burning pain all day.  [place] referred to [facility], Chief of Staff. Had a MRI; awaiting results; sees MD on 10/13 for results. Also reports some jaw popping occasionally on left side; burning just under skull on both sides but worse on the left; and right side upper trap tightness.0  Goes by  "James Huerta"    PERTINENT HISTORY:  No surgical history or injury  PAIN:  Are you having pain? Yes: NPRS scale: 2/10 Pain location: left side under skull; right side upper trap Pain description: burning under the skull, tightness Aggravating factors: movement Relieving factors: rest; mild relief with muscle relaxers but no lasting relief  PRECAUTIONS: None  WEIGHT BEARING RESTRICTIONS No  FALLS:  Has patient fallen in last  6 months? No  OCCUPATION: was a Furniture conservator/restorer for a long time; now working on a packing machine; mostly sitting  PLOF: [place] not hurt and have full range of motion in my neck  OBJECTIVE:   DIAGNOSTIC FINDINGS:  CLINICAL DATA:  Neck pain   EXAM: CERVICAL SPINE - COMPLETE 4+ VIEW   COMPARISON:  None Available.   FINDINGS: No recent fracture is seen. Alignment of posterior margin of vertebral bodies is unremarkable. Degenerative changes are noted with encroachment of neural foramina from C3-C7 levels, more severe at C5-C6 level. Prevertebral soft tissues are unremarkable.   IMPRESSION: No recent fracture is seen in cervical spine. Cervical spondylosis with encroachment of neural foramina from C3-C7 levels, more severe at C5-C6 level.     Electronically Signed   By: Elmer Picker M.D.   On: 12/24/2021 16:50  PATIENT SURVEYS:  FOTO 69   COGNITION: Overall cognitive status: Within functional limits for tasks assessed   SENSATION: WFL  POSTURE:  decreased cervical spine curvature; very straight  PALPATION: Mild tightness right side upper trap and paraspinals and periscapular structure.    CERVICAL ROM:   Active ROM AROM (deg) eval  Flexion 36  Extension 40  Right lateral flexion 45  Left lateral flexion 36  Right rotation 53  Left rotation 84   (Blank rows = not tested)   UPPER EXTREMITY MMT:  MMT Right eval Left eval  Shoulder flexion 5 5  Shoulder extension    Shoulder abduction 5 5  Shoulder  adduction    Shoulder extension    Shoulder internal rotation    Shoulder external rotation    Middle trapezius 5 5  Lower trapezius    Elbow flexion 5 5  Elbow extension    Wrist flexion    Wrist extension    Wrist ulnar deviation    Wrist radial deviation    Wrist pronation    Wrist supination    Grip strength     (Blank rows = not tested)  CERVICAL SPECIAL TESTS:  Distraction test: Negative   FUNCTIONAL TESTS:  5 times sit to stand: 12.59 sec   TODAY'S TREATMENT:  03/26/22: 3D cervical excursion 5x Lateral; forward/backward jaw movements 10x each 3D thoracic excursion 5x Seated cervical and scapular retraction UT stretch 2x 30"  Manual STM UT, scalenes, levator, manual traction, suboccipital release, PROM x 12 min in supine position  03/19/22 Review of HEP and goals Supine: Cervical retractions x 10 Scapular retractions 3" hold x 10 Supine MacKenzie cervical retraction and extension with rotation at end range x 5  Manual STM and cervical traction x 12' to decrease pain and increase soft tissue and joint mobility; no other interventions performed during manual treatment      Eval:Physical therapy evaluation and HEP instruction, manual cervical traction   PATIENT EDUCATION:  Education details: Patient educated on exam findings, POC, scope of PT, HEP. Person educated: Patient Education method: Explanation, Demonstration, and Handouts Education comprehension: verbalized understanding, returned demonstration, verbal cues required, and tactile cues required    HOME EXERCISE PROGRAM: Access Code: [email or web address] URL: [email or web address] Date: 03/01/2022 Prepared by: AP - Rehab 03/26/22: thoracic excursion, UT stretch, seated chin and scapular retraction  Exercises - Supine Chin Tuck  - 2 x daily - 7 x weekly - 1 sets - 10 reps - Supine Scapular Retraction  - 1 x daily - 7 x weekly - 1 sets - 10 reps   ASSESSMENT:  CLINICAL  IMPRESSION: Began session with  cervical excursions and mobility stretches to address c/o tightness.  Pt presents with good seated posture through session.  Pt stated difficult to complete supine exercises due to work schedule, progressed postural exercises to seated exercises, added thoracic excursion and UT stretches to HEP.  Manual STM complete to address moderate restrictions in cervical musculature Lt>Rt.  No reports of pain through session.  Encouraged to stay hydrated following manual to reduce risk of headache.    OBJECTIVE IMPAIRMENTS decreased activity tolerance, decreased endurance, decreased knowledge of condition, decreased mobility, decreased ROM, hypomobility, increased edema, increased fascial restrictions, impaired perceived functional ability, increased muscle spasms, impaired flexibility, postural dysfunction, and pain.   ACTIVITY LIMITATIONS bending, sleeping, reach over head, and hygiene/grooming  PARTICIPATION LIMITATIONS: driving and occupation   REHAB POTENTIAL: Good  CLINICAL DECISION MAKING: Evolving/moderate complexity  EVALUATION COMPLEXITY: Moderate   GOALS: Goals reviewed with patient? No  SHORT TERM GOALS: Target date: 03/15/2022    patient will be independent with initial HEP  Baseline:  Goal status: MET  2.  Patient will report at least 50% improvement in overall symptoms and/or function to demonstrate improved functional mobility  Baseline:  Goal status: IN PROGRESS    LONG TERM GOALS: Target date: 03/29/2022  Patient will report at least 75% improvement in overall symptoms and/or function to demonstrate improved functional mobility  Baseline:  Goal status: IN PROGRESS  2.  Patient will improve FOTO score to predicted value to demonstrate improved functional mobility  Baseline: 69 Goal status: IN PROGRESS  3.  Patient will improve right cervical rotation by 15 degrees to Improve ability to scan his environment to drive safely Baseline:  53 Goal status: IN PROGRESS  4.  Patient will be able to work all day without pain greater than 2/10 to improve work efficiency. Baseline: 3/10 Goal status: IN PROGRESS    PLAN: PT FREQUENCY: 2x/week  PT DURATION: 4 weeks  PLANNED INTERVENTIONS: Therapeutic exercises, Therapeutic activity, Neuromuscular re-education, Balance training, Gait training, Patient/Family education, Joint manipulation, Joint mobilization, Stair training, Orthotic/Fit training, DME instructions, Aquatic Therapy, Dry Needling, Electrical stimulation, Spinal manipulation, Spinal mobilization, Cryotherapy, Moist heat, Compression bandaging, scar mobilization, Splintting, Taping, Traction, Ultrasound, Ionotophoresis 56m/ml Dexamethasone, and Manual therapy  PLAN FOR NEXT SESSION: progress cervical mobility and strengthening as able; manual as needed; returns to MD 12/8; requests to try dry needling; dry needling instructions given today.  CIhor Austin LPTA/CLT; CDelana [phone] 3:35 PM, 03/26/22

## 2022-03-31 ENCOUNTER — Ambulatory Visit (HOSPITAL_COMMUNITY): Payer: 59

## 2022-03-31 DIAGNOSIS — M542 Cervicalgia: Secondary | ICD-10-CM

## 2022-03-31 NOTE — Therapy (Addendum)
OUTPATIENT PHYSICAL THERAPY CERVICAL PROGRESS NOTE Progress Note Reporting Period 03/01/2022 to 03/31/2022  See note below for Objective Data and Assessment of Progress/Goals.       Patient Name: James Huerta MRN: [id number] DOB:1991/01/17, 31 y.o., male Today's Date: 03/31/2022   PT End of Session - 03/31/22 1303     Visit Number 4    Number of Visits 8    Date for PT Re-Evaluation 03/29/22    Authorization Type UHC; no copay, no auth required    Authorization - Number of Visits 60    PT Start Time 1300    PT Stop Time 1340    PT Time Calculation (min) 40 min    Activity Tolerance Patient tolerated treatment well    Behavior During Therapy WFL for tasks assessed/performed               No past medical history on file. No past surgical history on file. There are no problems to display for this patient.   PCP: [place], [place] PROVIDER: PT eval/tx for neck pain on left side per Duffy Rhody, MD  Next apt: end of November, beginning of December  REFERRING DIAG: PT eval/tx for neck pain on left side per Duffy Rhody, MD   THERAPY DIAG:  Neck pain  Rationale for Evaluation and Treatment Rehabilitation  ONSET DATE: 5 years  SUBJECTIVE:                                                                                                                                                                                                         SUBJECTIVE STATEMENT: Pt stated he has some burning up Lt side of neck under skull, constant.  "Do feel like I loosen up more quickly in the mornings but the burning in the upper part of  my neck near the skull still the same"; MRI showed bulging disc x 2 in the cervical spine but wants to avoid surgery if at all possible.   Eval:Started with getting stiff in his upper neck early in the morning several years ago.  Mostly seems more stiff in the mornings but has burning pain all day.  [place] referred to  [facility], Chief of Staff. Had a MRI; awaiting results; sees MD on 10/13 for results. Also reports some jaw popping occasionally on left side; burning just under skull on both sides but worse on the left; and right side upper trap tightness.0  Goes by "James Huerta"    PERTINENT HISTORY:  No surgical history or injury  PAIN:  Are you having  pain? Yes: NPRS scale: 2/10 Pain location: left side under skull; right side upper trap Pain description: burning under the skull, tightness Aggravating factors: movement Relieving factors: rest; mild relief with muscle relaxers but no lasting relief  PRECAUTIONS: None  WEIGHT BEARING RESTRICTIONS No  FALLS:  Has patient fallen in last 6 months? No  OCCUPATION: was a Furniture conservator/restorer for a long time; now working on a packing machine; mostly sitting  PLOF: [place] not hurt and have full range of motion in my neck  OBJECTIVE:   DIAGNOSTIC FINDINGS:  CLINICAL DATA:  Neck pain   EXAM: CERVICAL SPINE - COMPLETE 4+ VIEW   COMPARISON:  None Available.   FINDINGS: No recent fracture is seen. Alignment of posterior margin of vertebral bodies is unremarkable. Degenerative changes are noted with encroachment of neural foramina from C3-C7 levels, more severe at C5-C6 level. Prevertebral soft tissues are unremarkable.   IMPRESSION: No recent fracture is seen in cervical spine. Cervical spondylosis with encroachment of neural foramina from C3-C7 levels, more severe at C5-C6 level.     Electronically Signed   By: Elmer Picker M.D.   On: 12/24/2021 16:50  PATIENT SURVEYS:  FOTO 69   COGNITION: Overall cognitive status: Within functional limits for tasks assessed   SENSATION: WFL  POSTURE:  decreased cervical spine curvature; very straight  PALPATION: Mild tightness right side upper trap and paraspinals and periscapular structure.    CERVICAL ROM:   Active ROM AROM (deg) eval AROM 03/31/22  Flexion 36   Extension  40   Right lateral flexion 45 40  Left lateral flexion 36 34  Right rotation 53 66  Left rotation 84 87   (Blank rows = not tested)   UPPER EXTREMITY MMT:  MMT Right eval Left eval Right 03/31/22 Left 03/31/22  Shoulder flexion _0 Shoulder extension      Shoulder abduction _1 Shoulder adduction      Shoulder extension      Shoulder internal rotation      Shoulder external rotation      Middle trapezius _2 Lower trapezius      Elbow flexion _3 Elbow extension      Wrist flexion      Wrist extension      Wrist ulnar deviation      Wrist radial deviation      Wrist pronation      Wrist supination      Grip strength       (Blank rows = not tested)  CERVICAL SPECIAL TESTS:  Distraction test: Negative   FUNCTIONAL TESTS:  5 times sit to stand: 12.59 sec   TODAY'S TREATMENT:  03/31/22 Progress note Seated  Cervical retractions x 10 Cervical retraction and extension x 10 Bilateral upper trap stretch 5 x 20"  Standing: BTB shoulder retraction 2 x 10 BTB shoulder extension 2 x 10  Prone: T's 2 x 10 Shoulder extension 2 x 10  Standing: Doorway stretch 5 x 20"  Supine: Manual lateral joint mobilizations cervical spine C2-C3     03/26/22: 3D cervical excursion 5x Lateral; forward/backward jaw movements 10x each 3D thoracic excursion 5x Seated cervical and scapular retraction UT stretch 2x 30"  Manual STM UT, scalenes, levator, manual traction, suboccipital release, PROM x 12 min in supine position  03/19/22 Review of HEP and goals Supine: Cervical retractions x 10 Scapular retractions 3" hold x 10 Supine  MacKenzie cervical retraction and extension with rotation at end range x 5  Manual STM and cervical traction x 12' to decrease pain and increase soft tissue and joint mobility; no other interventions performed during manual treatment      Eval:Physical therapy evaluation and HEP instruction, manual cervical  traction   PATIENT EDUCATION:  Education details: Patient educated on exam findings, POC, scope of PT, HEP. Person educated: Patient Education method: Explanation, Demonstration, and Handouts Education comprehension: verbalized understanding, returned demonstration, verbal cues required, and tactile cues required    HOME EXERCISE PROGRAM: Access Code: [email or web address] URL: [email or web address] Date: 03/01/2022 Prepared by: AP - Rehab 03/26/22: thoracic excursion, UT stretch, seated chin and scapular retraction  Exercises - Supine Chin Tuck  - 2 x daily - 7 x weekly - 1 sets - 10 reps - Supine Scapular Retraction  - 1 x daily - 7 x weekly - 1 sets - 10 reps   ASSESSMENT:  CLINICAL IMPRESSION:  Today's session continued to address cervical mobility and postural strengthening.  Patient continues with complaint of "burning" just under the left side of his skull.  Trial of cervical spine mobilizations; improved right rotation after mobilization noted although still less that left rotation. Pain seems to be more joint related in nature than muscular so deferred today.  Added cervical retraction with neck flexed to HEP today. Patient will benefit from continued skilled therapy services to address deficits and promote return to optimal function and achieve unmet and partially met goals.       OBJECTIVE IMPAIRMENTS decreased activity tolerance, decreased endurance, decreased knowledge of condition, decreased mobility, decreased ROM, hypomobility, increased edema, increased fascial restrictions, impaired perceived functional ability, increased muscle spasms, impaired flexibility, postural dysfunction, and pain.   ACTIVITY LIMITATIONS bending, sleeping, reach over head, and hygiene/grooming  PARTICIPATION LIMITATIONS: driving and occupation   REHAB POTENTIAL: Good  CLINICAL DECISION MAKING: Evolving/moderate complexity  EVALUATION COMPLEXITY: Moderate   GOALS: Goals reviewed  with patient? No  SHORT TERM GOALS: Target date: 03/15/2022    patient will be independent with initial HEP  Baseline:  Goal status: MET  2.  Patient will report at least 50% improvement in overall symptoms and/or function to demonstrate improved functional mobility  Baseline:  Goal status: IN PROGRESS    LONG TERM GOALS: Target date: 04/28/2022  Patient will report at least 75% improvement in overall symptoms and/or function to demonstrate improved functional mobility  Baseline:  Goal status: IN PROGRESS  2.  Patient will improve FOTO score to predicted value to demonstrate improved functional mobility  Baseline: 69 Goal status: IN PROGRESS  3.  Patient will improve right cervical rotation by 15 degrees to Improve ability to scan his environment to drive safely Baseline: 53; 03/31/22 66 degrees Goal status: IN PROGRESS  4.  Patient will be able to work all day without pain greater than 2/10 to improve work efficiency. Baseline: 3/10 Goal status: IN PROGRESS    PLAN: PT FREQUENCY: 2x/week  PT DURATION: 4 more weeks  PLANNED INTERVENTIONS: Therapeutic exercises, Therapeutic activity, Neuromuscular re-education, Balance training, Gait training, Patient/Family education, Joint manipulation, Joint mobilization, Stair training, Orthotic/Fit training, DME instructions, Aquatic Therapy, Dry Needling, Electrical stimulation, Spinal manipulation, Spinal mobilization, Cryotherapy, Moist heat, Compression bandaging, scar mobilization, Splintting, Taping, Traction, Ultrasound, Ionotophoresis 82m/ml Dexamethasone, and Manual therapy  PLAN FOR NEXT SESSION: progress cervical mobility and strengthening as able; manual as needed; returns to MD 12/8; FOTO  2:14 PM, 03/31/22 Brie Eppard Small Klye Besecker MPT [facility] [place] #  [id number] Ph:[phone]

## 2022-03-31 NOTE — Addendum Note (Signed)
Addended byWilhemena Durie S on: 03/31/2022 02:24 PM   Modules accepted: Orders

## 2022-04-02 ENCOUNTER — Ambulatory Visit (HOSPITAL_COMMUNITY): Payer: 59

## 2022-04-02 DIAGNOSIS — M542 Cervicalgia: Secondary | ICD-10-CM | POA: Diagnosis not present

## 2022-04-02 NOTE — Therapy (Signed)
OUTPATIENT PHYSICAL THERAPY CERVICAL PROGRESS NOTE Progress Note Reporting Period 03/01/2022 to 03/31/2022  See note below for Objective Data and Assessment of Progress/Goals.       Patient Name: James Huerta MRN: [id number] DOB:[birth year], 31 y.o., male Today's Date: 04/02/2022   PT End of Session - 04/02/22 1301     Visit Number 5    Number of Visits 16    Date for PT Re-Evaluation 04/28/22    Authorization Type UHC; no copay, no auth required    Authorization - Number of Visits 60    PT Start Time 1300    PT Stop Time 1340    PT Time Calculation (min) 40 min    Activity Tolerance Patient tolerated treatment well    Behavior During Therapy WFL for tasks assessed/performed               No past medical history on file. No past surgical history on file. There are no problems to display for this patient.   PCP: [place], [place] PROVIDER: PT eval/tx for neck pain on left side per Duffy Rhody, MD  Next apt: end of November, beginning of December  REFERRING DIAG: PT eval/tx for neck pain on left side per Duffy Rhody, MD   THERAPY DIAG:  Neck pain  Rationale for Evaluation and Treatment Rehabilitation  ONSET DATE: 5 years  SUBJECTIVE:                                                                                                                                                                                                         SUBJECTIVE STATEMENT: Patient reports less burning left side under skull today but still have some tightness right upper trap.    Eval:Started with getting stiff in his upper neck early in the morning several years ago.  Mostly seems more stiff in the mornings but has burning pain all day.  [place] referred to [facility], Chief of Staff. Had a MRI; awaiting results; sees MD on 10/13 for results. Also reports some jaw popping occasionally on left side; burning just under skull on both sides but worse on the  left; and right side upper trap tightness.0  Goes by "James Huerta"    PERTINENT HISTORY:  No surgical history or injury  PAIN:  Are you having pain? Yes: NPRS scale: 1-2/10 Pain location: left side under skull; right side upper trap Pain description: burning under the skull, tightness Aggravating factors: movement Relieving factors: rest; mild relief with muscle relaxers but no lasting relief  PRECAUTIONS: None  WEIGHT BEARING RESTRICTIONS  No  FALLS:  Has patient fallen in last 6 months? No  OCCUPATION: was a Furniture conservator/restorer for a long time; now working on a packing machine; mostly sitting  PLOF: [place] not hurt and have full range of motion in my neck  OBJECTIVE:   DIAGNOSTIC FINDINGS:  CLINICAL DATA:  Neck pain   EXAM: CERVICAL SPINE - COMPLETE 4+ VIEW   COMPARISON:  None Available.   FINDINGS: No recent fracture is seen. Alignment of posterior margin of vertebral bodies is unremarkable. Degenerative changes are noted with encroachment of neural foramina from C3-C7 levels, more severe at C5-C6 level. Prevertebral soft tissues are unremarkable.   IMPRESSION: No recent fracture is seen in cervical spine. Cervical spondylosis with encroachment of neural foramina from C3-C7 levels, more severe at C5-C6 level.     Electronically Signed   By: Elmer Picker M.D.   On: 12/24/2021 16:50  PATIENT SURVEYS:  FOTO 69   COGNITION: Overall cognitive status: Within functional limits for tasks assessed   SENSATION: WFL  POSTURE:  decreased cervical spine curvature; very straight  PALPATION: Mild tightness right side upper trap and paraspinals and periscapular structure.    CERVICAL ROM:   Active ROM AROM (deg) eval AROM 03/31/22  Flexion 36   Extension 40   Right lateral flexion 45 40  Left lateral flexion 36 34  Right rotation 53 66  Left rotation 84 87   (Blank rows = not tested)   UPPER EXTREMITY MMT:  MMT Right eval Left eval  Right 03/31/22 Left 03/31/22  Shoulder flexion _0 Shoulder extension      Shoulder abduction _1 Shoulder adduction      Shoulder extension      Shoulder internal rotation      Shoulder external rotation      Middle trapezius _2 Lower trapezius      Elbow flexion _3 Elbow extension      Wrist flexion      Wrist extension      Wrist ulnar deviation      Wrist radial deviation      Wrist pronation      Wrist supination      Grip strength       (Blank rows = not tested)  CERVICAL SPECIAL TESTS:  Distraction test: Negative   FUNCTIONAL TESTS:  5 times sit to stand: 12.59 sec   TODAY'S TREATMENT:  04/02/22 FOTO 69 Seated: Cervical retraction x 10 Cervical retraction and extension x 10 Thoracic extension x 10 Bilateral upper trap stretch 5 x 20" Cervical extension with towel x 10 Cervical rotation with towel x 10 Levator stretch 5 x 20"  Seated cervical right rotation 65 degrees today  Supine: Cervical retraction with head flexed 5" hold x 10      03/31/22 Progress note Seated  Cervical retractions x 10 Cervical retraction and extension x 10 Bilateral upper trap stretch 5 x 20"  Standing: BTB shoulder retraction 2 x 10 BTB shoulder extension 2 x 10  Prone: T's 2 x 10 Shoulder extension 2 x 10  Standing: Doorway stretch 5 x 20"  Supine: Manual lateral joint mobilizations cervical spine C2-C3     03/26/22: 3D cervical excursion 5x Lateral; forward/backward jaw movements 10x each 3D thoracic excursion 5x Seated cervical and scapular retraction UT stretch 2x 30"  Manual STM UT, scalenes, levator, manual traction, suboccipital release, PROM x 12 min  in supine position  03/19/22 Review of HEP and goals Supine: Cervical retractions x 10 Scapular retractions 3" hold x 10 Supine MacKenzie cervical retraction and extension with rotation at end range x 5  Manual STM and cervical traction x 12' to decrease pain and increase  soft tissue and joint mobility; no other interventions performed during manual treatment      Eval:Physical therapy evaluation and HEP instruction, manual cervical traction   PATIENT EDUCATION:  Education details: Patient educated on exam findings, POC, scope of PT, HEP. Person educated: Patient Education method: Explanation, Demonstration, and Handouts Education comprehension: verbalized understanding, returned demonstration, verbal cues required, and tactile cues required    HOME EXERCISE PROGRAM: 04/02/22 Cervical rotation and extension with strap  Access Code: [email or web address] URL: [email or web address] Date: 03/01/2022 Prepared by: AP - Rehab 03/26/22: thoracic excursion, UT stretch, seated chin and scapular retraction  Exercises - Supine Chin Tuck  - 2 x daily - 7 x weekly - 1 sets - 10 reps - Supine Scapular Retraction  - 1 x daily - 7 x weekly - 1 sets - 10 reps   ASSESSMENT:  CLINICAL IMPRESSION:  Continue with cervical mobility and postural strengthening today.  Patient continues with very little curve in his cervical spine; noted tenderness right levator so added levator stretch to treatment; trial of using towel to increase cervical mobility and updated HEP.  Patient will benefit from continued skilled therapy services to address deficits and promote return to optimal function and achieve unmet and partially met goals.       OBJECTIVE IMPAIRMENTS decreased activity tolerance, decreased endurance, decreased knowledge of condition, decreased mobility, decreased ROM, hypomobility, increased edema, increased fascial restrictions, impaired perceived functional ability, increased muscle spasms, impaired flexibility, postural dysfunction, and pain.   ACTIVITY LIMITATIONS bending, sleeping, reach over head, and hygiene/grooming  PARTICIPATION LIMITATIONS: driving and occupation   REHAB POTENTIAL: Good  CLINICAL DECISION MAKING: Evolving/moderate  complexity  EVALUATION COMPLEXITY: Moderate   GOALS: Goals reviewed with patient? No  SHORT TERM GOALS: Target date: 03/15/2022    patient will be independent with initial HEP  Baseline:  Goal status: MET  2.  Patient will report at least 50% improvement in overall symptoms and/or function to demonstrate improved functional mobility  Baseline:  Goal status: IN PROGRESS    LONG TERM GOALS: Target date: 04/28/2022  Patient will report at least 75% improvement in overall symptoms and/or function to demonstrate improved functional mobility  Baseline:  Goal status: IN PROGRESS  2.  Patient will improve FOTO score to predicted value to demonstrate improved functional mobility  Baseline: 69 Goal status: IN PROGRESS  3.  Patient will improve right cervical rotation by 15 degrees to Improve ability to scan his environment to drive safely Baseline: 53; 03/31/22 66 degrees Goal status: IN PROGRESS  4.  Patient will be able to work all day without pain greater than 2/10 to improve work efficiency. Baseline: 3/10 Goal status: IN PROGRESS    PLAN: PT FREQUENCY: 2x/week  PT DURATION: 4 more weeks  PLANNED INTERVENTIONS: Therapeutic exercises, Therapeutic activity, Neuromuscular re-education, Balance training, Gait training, Patient/Family education, Joint manipulation, Joint mobilization, Stair training, Orthotic/Fit training, DME instructions, Aquatic Therapy, Dry Needling, Electrical stimulation, Spinal manipulation, Spinal mobilization, Cryotherapy, Moist heat, Compression bandaging, scar mobilization, Splintting, Taping, Traction, Ultrasound, Ionotophoresis 55m/ml Dexamethasone, and Manual therapy  PLAN FOR NEXT SESSION: progress cervical mobility and strengthening as able; manual as needed; returns to MD 12/8; DN?  1:45 PM, 04/02/22 Isai Gottlieb Small Valmai Vandenberghe MPT [facility] [place] [phone]

## 2022-04-05 ENCOUNTER — Encounter (HOSPITAL_COMMUNITY): Payer: 59 | Admitting: Physical Therapy

## 2022-04-07 ENCOUNTER — Encounter (HOSPITAL_COMMUNITY): Payer: 59

## 2022-04-20 ENCOUNTER — Encounter (HOSPITAL_COMMUNITY): Payer: 59 | Admitting: Physical Therapy

## 2022-04-26 ENCOUNTER — Ambulatory Visit (HOSPITAL_COMMUNITY): Payer: 59 | Attending: Internal Medicine | Admitting: Physical Therapy

## 2022-04-26 DIAGNOSIS — M542 Cervicalgia: Secondary | ICD-10-CM | POA: Insufficient documentation

## 2022-04-26 NOTE — Therapy (Signed)
OUTPATIENT PHYSICAL THERAPY CERVICAL PROGRESS NOTE  See note below for Objective Data and Assessment of Progress/Goals.       Patient Name: James Huerta MRN: [id number] DOB:March 26, 1991, 31 y.o., male Today's Date: 04/26/2022   PT End of Session - 04/26/22 0819     Visit Number 6    Number of Visits 16    Date for PT Re-Evaluation 04/28/22    Authorization Type UHC; no copay, no auth required    Authorization - Number of Visits 60    PT Start Time [phone]    PT Stop Time 0857    PT Time Calculation (min) 40 min    Activity Tolerance Patient tolerated treatment well    Behavior During Therapy [facility] for tasks assessed/performed               No past medical history on file. No past surgical history on file. There are no problems to display for this patient.   PCP: [place], [place] PROVIDER: PT eval/tx for neck pain on left side per Duffy Rhody, MD  Next apt: end of November, beginning of December  REFERRING DIAG: PT eval/tx for neck pain on left side per Duffy Rhody, MD   THERAPY DIAG:  Neck pain  Rationale for Evaluation and Treatment Rehabilitation  ONSET DATE: 5 years  SUBJECTIVE:                                                                                                                                                                                                         SUBJECTIVE STATEMENT: Neck is terrible. Still pain with movement. No pain at rest. Has not been doing updated HEP. Wants to try dry needling.    Eval:Started with getting stiff in his upper neck early in the morning several years ago.  Mostly seems more stiff in the mornings but has burning pain all day.  [place] referred to [facility], Chief of Staff. Had a MRI; awaiting results; sees MD on 10/13 for results. Also reports some jaw popping occasionally on left side; burning just under skull on both sides but worse on the left; and right side upper trap  tightness.0  Goes by "James Huerta"    PERTINENT HISTORY:  No surgical history or injury  PAIN:  Are you having pain? Yes: NPRS scale: 0/10 Pain location: left side under skull; right side upper trap Pain description: burning under the skull, tightness Aggravating factors: movement Relieving factors: rest; mild relief with muscle relaxers but no lasting relief  PRECAUTIONS: None  WEIGHT BEARING RESTRICTIONS No  FALLS:  Has patient fallen in last 6 months? No  OCCUPATION: was a Furniture conservator/restorer for a long time; now working on a packing machine; mostly sitting  PLOF: [place] not hurt and have full range of motion in my neck  OBJECTIVE:   DIAGNOSTIC FINDINGS:  CLINICAL DATA:  Neck pain   EXAM: CERVICAL SPINE - COMPLETE 4+ VIEW   COMPARISON:  None Available.   FINDINGS: No recent fracture is seen. Alignment of posterior margin of vertebral bodies is unremarkable. Degenerative changes are noted with encroachment of neural foramina from C3-C7 levels, more severe at C5-C6 level. Prevertebral soft tissues are unremarkable.   IMPRESSION: No recent fracture is seen in cervical spine. Cervical spondylosis with encroachment of neural foramina from C3-C7 levels, more severe at C5-C6 level.     Electronically Signed   By: Elmer Picker M.D.   On: 12/24/2021 16:50  PATIENT SURVEYS:  FOTO 69   COGNITION: Overall cognitive status: Within functional limits for tasks assessed   SENSATION: WFL  POSTURE:  decreased cervical spine curvature; very straight  PALPATION: Mild tightness right side upper trap and paraspinals and periscapular structure.    CERVICAL ROM:   Active ROM AROM (deg) eval AROM 03/31/22  Flexion 36   Extension 40   Right lateral flexion 45 40  Left lateral flexion 36 34  Right rotation 53 66  Left rotation 84 87   (Blank rows = not tested)   UPPER EXTREMITY MMT:  MMT Right eval Left eval Right 03/31/22 Left 03/31/22   Shoulder flexion _0 Shoulder extension      Shoulder abduction _1 Shoulder adduction      Shoulder extension      Shoulder internal rotation      Shoulder external rotation      Middle trapezius _2 Lower trapezius      Elbow flexion _3 Elbow extension      Wrist flexion      Wrist extension      Wrist ulnar deviation      Wrist radial deviation      Wrist pronation      Wrist supination      Grip strength       (Blank rows = not tested)  CERVICAL SPECIAL TESTS:  Distraction test: Negative   FUNCTIONAL TESTS:  5 times sit to stand: 12.59 sec   TODAY'S TREATMENT:  04/26/22 Manual STM to Lt cervical paraspinals, RT levator and upper trap pre and post dry needling for trigger point identification and surface area preparation    Trigger Point Dry-Needling  Treatment instructions: Expect mild to moderate muscle soreness. S/S of pneumothorax if dry needled over a lung field, and to seek immediate medical attention should they occur. Patient verbalized understanding of these instructions and education.  Patient Consent Given: Yes Education handout provided: Yes Muscles treated: Lt cervical paraspinals, RT levator and upper trap Electrical stimulation performed: No Parameters: N/A Treatment response/outcome: good tolerance  Cervical sidebend isometric 5 x 5" each  Cervical rotation isometric 5 x 5" each  Prone chin tuck 10 x 5 "  BTB rows x 20 BTB shoulder extension x20 BTB shoulder bilat externa rotation x 20  04/02/22 FOTO 69 Seated: Cervical retraction x 10 Cervical retraction and extension x 10 Thoracic extension x 10 Bilateral upper trap stretch 5 x 20" Cervical extension with towel x 10 Cervical rotation with towel x  10 Levator stretch 5 x 20"  Seated cervical right rotation 65 degrees today  Supine: Cervical retraction with head flexed 5" hold x 10      03/31/22 Progress note Seated  Cervical retractions x 10 Cervical  retraction and extension x 10 Bilateral upper trap stretch 5 x 20"  Standing: BTB shoulder retraction 2 x 10 BTB shoulder extension 2 x 10  Prone: T's 2 x 10 Shoulder extension 2 x 10  Standing: Doorway stretch 5 x 20"  Supine: Manual lateral joint mobilizations cervical spine C2-C3   PATIENT EDUCATION:  Education details: Patient educated on exam findings, POC, scope of PT, HEP. Person educated: Patient Education method: Explanation, Demonstration, and Handouts Education comprehension: verbalized understanding, returned demonstration, verbal cues required, and tactile cues required    HOME EXERCISE PROGRAM: 04/02/22 Cervical rotation and extension with strap  Access Code: [email or web address] URL: [email or web address]/26/[email or web address]  - Standing Isometric Cervical Sidebending with Manual Resistance  - 2-3 x daily - 7 x weekly - 1 sets - 10 reps - 5 second hold - Seated Isometric Cervical Rotation  - 2-3 x daily - 7 x weekly - 1 sets - 10 reps - 5 second hold  Date: 03/01/2022 Prepared by: AP - Rehab 03/26/22: thoracic excursion, UT stretch, seated chin and scapular retraction  Exercises - Supine Chin Tuck  - 2 x daily - 7 x weekly - 1 sets - 10 reps - Supine Scapular Retraction  - 1 x daily - 7 x weekly - 1 sets - 10 reps   ASSESSMENT:  CLINICAL IMPRESSION:  Performed dry needling with good tolerance. No noted subjective improvement following. Continued with postural strength progressions Patient shows good return. Introduced cervical isometric series. Patient did note improvement tin symptoms following. Noting improved pain free cervical AROM end of session. Added cervical isometric series to HEP and issued handout. Patient will continue to benefit from skilled therapy services to reduce remaining deficits and improve functional ability.     OBJECTIVE IMPAIRMENTS decreased activity tolerance, decreased endurance, decreased knowledge of condition, decreased mobility,  decreased ROM, hypomobility, increased edema, increased fascial restrictions, impaired perceived functional ability, increased muscle spasms, impaired flexibility, postural dysfunction, and pain.   ACTIVITY LIMITATIONS bending, sleeping, reach over head, and hygiene/grooming  PARTICIPATION LIMITATIONS: driving and occupation   REHAB POTENTIAL: Good  CLINICAL DECISION MAKING: Evolving/moderate complexity  EVALUATION COMPLEXITY: Moderate   GOALS: Goals reviewed with patient? No  SHORT TERM GOALS: Target date: 03/15/2022    patient will be independent with initial HEP  Baseline:  Goal status: MET  2.  Patient will report at least 50% improvement in overall symptoms and/or function to demonstrate improved functional mobility  Baseline:  Goal status: IN PROGRESS    LONG TERM GOALS: Target date: 04/28/2022  Patient will report at least 75% improvement in overall symptoms and/or function to demonstrate improved functional mobility  Baseline:  Goal status: IN PROGRESS  2.  Patient will improve FOTO score to predicted value to demonstrate improved functional mobility  Baseline: 69 Goal status: IN PROGRESS  3.  Patient will improve right cervical rotation by 15 degrees to Improve ability to scan his environment to drive safely Baseline: 53; 03/31/22 66 degrees Goal status: IN PROGRESS  4.  Patient will be able to work all day without pain greater than 2/10 to improve work efficiency. Baseline: 3/10 Goal status: IN PROGRESS    PLAN: PT FREQUENCY: 2x/week  PT DURATION: 4 more weeks  PLANNED INTERVENTIONS: Therapeutic exercises, Therapeutic activity, Neuromuscular  re-education, Balance training, Gait training, Patient/Family education, Joint manipulation, Joint mobilization, Stair training, Orthotic/Fit training, DME instructions, Aquatic Therapy, Dry Needling, Electrical stimulation, Spinal manipulation, Spinal mobilization, Cryotherapy, Moist heat, Compression bandaging,  scar mobilization, Splintting, Taping, Traction, Ultrasound, Ionotophoresis 6m/ml Dexamethasone, and Manual therapy  PLAN FOR NEXT SESSION: progress cervical mobility and strengthening as able; manual as needed; returns to MD 12/8. F/u on dry needling  8:19 AM, 04/26/22 CJosue HectorPT DPT  [facility] ([phone]

## 2022-04-28 ENCOUNTER — Encounter (HOSPITAL_COMMUNITY): Payer: Self-pay | Admitting: Physical Therapy

## 2022-04-28 ENCOUNTER — Ambulatory Visit (HOSPITAL_COMMUNITY): Payer: 59 | Admitting: Physical Therapy

## 2022-04-28 DIAGNOSIS — M542 Cervicalgia: Secondary | ICD-10-CM | POA: Diagnosis not present

## 2022-04-28 NOTE — Therapy (Signed)
OUTPATIENT PHYSICAL THERAPY CERVICAL PROGRESS NOTE  See note below for Objective Data and Assessment of Progress/Goals.       Patient Name: James Huerta MRN: [id number] DOB:1990-05-27, 31 y.o., male Today's Date: 04/28/2022 PHYSICAL THERAPY DISCHARGE SUMMARY  Visits from Start of Care: 7  Current functional level related to goals / functional outcomes: See below   Remaining deficits: See below   Education / Equipment: See assessment   Patient agrees to discharge. Patient goals were partially met. Patient is being discharged due to maximized rehab potential.    PT End of Session - 04/28/22 0819     Visit Number 7    Number of Visits 16    Date for PT Re-Evaluation 04/28/22    Authorization Type UHC; no copay, no auth required    Authorization - Number of Visits 60    PT Start Time [phone]    PT Stop Time 0847    PT Time Calculation (min) 30 min    Activity Tolerance Patient tolerated treatment well    Behavior During Therapy [facility] for tasks assessed/performed               History reviewed. No pertinent past medical history. History reviewed. No pertinent surgical history. There are no problems to display for this patient.   PCP: [place], [place] PROVIDER: PT eval/tx for neck pain on left side per Duffy Rhody, MD  Next apt: end of November, beginning of December  REFERRING DIAG: PT eval/tx for neck pain on left side per Duffy Rhody, MD   THERAPY DIAG:  Neck pain  Rationale for Evaluation and Treatment Rehabilitation  ONSET DATE: 5 years  SUBJECTIVE:                                                                                                                                                                                                         SUBJECTIVE STATEMENT: Patient reports feeling better since last session. He feels needling was helpful for upper trap and neck tension. He is seeing MD Friday and feels ready  for DC today.   Eval:Started with getting stiff in his upper neck early in the morning several years ago.  Mostly seems more stiff in the mornings but has burning pain all day.  [place] referred to [name], Chief of Staff. Had a MRI; awaiting results; sees MD on 10/13 for results. Also reports some jaw popping occasionally on left side; burning just under skull on both sides but worse on the left; and right side upper trap tightness.0  Goes by "  James Huerta"    PERTINENT HISTORY:  No surgical history or injury  PAIN:  Are you having pain? Yes: NPRS scale: 0/10 Pain location: left side under skull; right side upper trap Pain description: burning under the skull, tightness Aggravating factors: movement Relieving factors: rest; mild relief with muscle relaxers but no lasting relief  PRECAUTIONS: None  WEIGHT BEARING RESTRICTIONS No  FALLS:  Has patient fallen in last 6 months? No  OCCUPATION: was a Furniture conservator/restorer for a long time; now working on a packing machine; mostly sitting  PLOF: [place] not hurt and have full range of motion in my neck  OBJECTIVE:   DIAGNOSTIC FINDINGS:  CLINICAL DATA:  Neck pain   EXAM: CERVICAL SPINE - COMPLETE 4+ VIEW   COMPARISON:  None Available.   FINDINGS: No recent fracture is seen. Alignment of posterior margin of vertebral bodies is unremarkable. Degenerative changes are noted with encroachment of neural foramina from C3-C7 levels, more severe at C5-C6 level. Prevertebral soft tissues are unremarkable.   IMPRESSION: No recent fracture is seen in cervical spine. Cervical spondylosis with encroachment of neural foramina from C3-C7 levels, more severe at C5-C6 level.     Electronically Signed   By: Elmer Picker M.D.   On: 12/24/2021 16:50  PATIENT SURVEYS:  FOTO 69  (no change)    COGNITION: Overall cognitive status: Within functional limits for tasks assessed   SENSATION: WFL  POSTURE:  decreased cervical spine  curvature; very straight  PALPATION: Mild tightness right side upper trap and paraspinals and periscapular structure.    CERVICAL ROM:   Active ROM AROM (deg) eval AROM 03/31/22 AROM 04/28/22   Flexion 36  60  Extension 40  40  Right lateral flexion 45 40   Left lateral flexion 36 34   Right rotation 53 66 72  Left rotation 84 87 78   (Blank rows = not tested)   UPPER EXTREMITY MMT:  MMT Right eval Left eval Right 03/31/22 Left 03/31/22  Shoulder flexion _0 Shoulder extension      Shoulder abduction _1 Shoulder adduction      Shoulder extension      Shoulder internal rotation      Shoulder external rotation      Middle trapezius _2 Lower trapezius      Elbow flexion _3 Elbow extension      Wrist flexion      Wrist extension      Wrist ulnar deviation      Wrist radial deviation      Wrist pronation      Wrist supination      Grip strength       (Blank rows = not tested)  CERVICAL SPECIAL TESTS:  Distraction test: Negative   FUNCTIONAL TESTS:  5 times sit to stand: 12.59 sec   TODAY'S TREATMENT:  04/28/22 Reassess FOTO AROM HEP review   04/26/22 Manual STM to Lt cervical paraspinals, RT levator and upper trap pre and post dry needling for trigger point identification and surface area preparation    Trigger Point Dry-Needling  Treatment instructions: Expect mild to moderate muscle soreness. S/S of pneumothorax if dry needled over a lung field, and to seek immediate medical attention should they occur. Patient verbalized understanding of these instructions and education.  Patient Consent Given: Yes Education handout provided: Yes Muscles treated: Lt cervical paraspinals, RT levator and upper trap Electrical stimulation  performed: No Parameters: N/A Treatment response/outcome: good tolerance  Cervical sidebend isometric 5 x 5" each  Cervical rotation isometric 5 x 5" each  Prone chin tuck 10 x 5 "  BTB rows x 20 BTB  shoulder extension x20 BTB shoulder bilat externa rotation x 20  PATIENT EDUCATION:  Education details: Patient educated on exam findings, POC, scope of PT, HEP. Person educated: Patient Education method: Explanation, Demonstration, and Handouts Education comprehension: verbalized understanding, returned demonstration, verbal cues required, and tactile cues required    HOME EXERCISE PROGRAM: 04/02/22 Cervical rotation and extension with strap  Access Code: [email or web address] URL: [email or web address] 04/28/22  Access Code: [email or web address] URL: [email or web address] Date: 04/28/2022 Prepared by: Josue Hector  Exercises - Supine Chin Tuck  - 1-2 x daily - 5-7 x weekly - 1 sets - 10 reps - Supine Scapular Retraction  - 1-2 x daily - 5-7 x weekly - 1 sets - 10 reps - Seated Upper Trapezius Stretch  - 1-2 x daily - 5-7 x weekly - 1 sets - 5 reps - 20" hold - Supine Deep Neck Flexor Training - Repetitions  - 1-2 x daily - 5-7 x weekly - 2 sets - 10 reps - Seated Assisted Cervical Rotation with Towel  - 1-2 x daily - 5-7 x weekly - 3 sets - 10 reps - Cervical Extension AROM with Strap  - 1-2 x daily - 5-7 x weekly - 3 sets - 10 reps - Seated Levator Scapulae Stretch  - 1-2 x daily - 5-7 x weekly - 1 sets - 5 reps - 20 sec hold - Standing Isometric Cervical Sidebending with Manual Resistance  - 1-2 x daily - 5-7 x weekly - 1 sets - 10 reps - 5 second hold - Seated Isometric Cervical Rotation  - 1-2 x daily - 5-7 x weekly - 1 sets - 10 reps - 5 second hold - Standing Shoulder Row with Anchored Resistance  - 1-2 x daily - 5-7 x weekly - 2 sets - 10 reps - Shoulder extension with resistance - Neutral  - 1-2 x daily - 5-7 x weekly - 2 sets - 10 reps - Shoulder External Rotation and Scapular Retraction with Resistance  - 1-2 x daily - 5-7 x weekly - 2 sets - 10 reps  04/26/22  - Standing Isometric Cervical Sidebending with Manual Resistance  - 2-3 x daily - 7 x weekly - 1 sets  - 10 reps - 5 second hold - Seated Isometric Cervical Rotation  - 2-3 x daily - 7 x weekly - 1 sets - 10 reps - 5 second hold  Date: 03/01/2022 Prepared by: AP - Rehab 03/26/22: thoracic excursion, UT stretch, seated chin and scapular retraction  Exercises - Supine Chin Tuck  - 2 x daily - 7 x weekly - 1 sets - 10 reps - Supine Scapular Retraction  - 1 x daily - 7 x weekly - 1 sets - 10 reps   ASSESSMENT:  CLINICAL IMPRESSION:  Patient shows modest improvement since starting therapy. He has made good objective improvements and has good MMT and AROM at this point. He reports ongoing neck pain when at end range of motion and with prolonged positioning, namely sitting at work. He did report improved symptoms in trapezius and peri scapular area with dry needling, but no significant change in upper cervical issues. At this point, patient will be DC due to max benefit reached. Reviewed comprehensive HEP and answered all patient questions. Encouraged patient to follow up with  therapy services with any further questions or concerns.   OBJECTIVE IMPAIRMENTS decreased activity tolerance, decreased endurance, decreased knowledge of condition, decreased mobility, decreased ROM, hypomobility, increased edema, increased fascial restrictions, impaired perceived functional ability, increased muscle spasms, impaired flexibility, postural dysfunction, and pain.   ACTIVITY LIMITATIONS bending, sleeping, reach over head, and hygiene/grooming  PARTICIPATION LIMITATIONS: driving and occupation   REHAB POTENTIAL: Good  CLINICAL DECISION MAKING: Evolving/moderate complexity  EVALUATION COMPLEXITY: Moderate   GOALS: Goals reviewed with patient? No  SHORT TERM GOALS: Target date: 03/15/2022    patient will be independent with initial HEP  Baseline:  Goal status: MET  2.  Patient will report at least 50% improvement in overall symptoms and/or function to demonstrate improved functional  mobility  Baseline: 25% Goal status: NOT MET    LONG TERM GOALS: Target date: 04/28/2022  Patient will report at least 75% improvement in overall symptoms and/or function to demonstrate improved functional mobility  Baseline: Reports 25%  Goal status: NOT MET  2.  Patient will improve FOTO score to predicted value to demonstrate improved functional mobility  Baseline: 69 Goal status: NOT MET  3.  Patient will improve right cervical rotation by 15 degrees to Improve ability to scan his environment to drive safely Baseline: 19 deg  Goal status: MET  4.  Patient will be able to work all day without pain greater than 2/10 to improve work efficiency. Baseline: 2/10 Goal status: MET    PLAN: PT FREQUENCY: 2x/week  PT DURATION: 4 more weeks  PLANNED INTERVENTIONS: Therapeutic exercises, Therapeutic activity, Neuromuscular re-education, Balance training, Gait training, Patient/Family education, Joint manipulation, Joint mobilization, Stair training, Orthotic/Fit training, DME instructions, Aquatic Therapy, Dry Needling, Electrical stimulation, Spinal manipulation, Spinal mobilization, Cryotherapy, Moist heat, Compression bandaging, scar mobilization, Splintting, Taping, Traction, Ultrasound, Ionotophoresis 66m/ml Dexamethasone, and Manual therapy  PLAN FOR NEXT SESSION:  DC to HEP   9:22 AM, 04/28/22 CJosue HectorPT DPT  [facility] ([phone]

## 2022-11-03 ENCOUNTER — Other Ambulatory Visit (HOSPITAL_COMMUNITY): Payer: Self-pay | Admitting: Internal Medicine

## 2022-11-03 DIAGNOSIS — R221 Localized swelling, mass and lump, neck: Secondary | ICD-10-CM

## 2022-12-02 ENCOUNTER — Ambulatory Visit (HOSPITAL_COMMUNITY)
Admission: RE | Admit: 2022-12-02 | Discharge: 2022-12-02 | Disposition: A | Discharge status: Home / Self Care | Payer: 59 | Source: Ambulatory Visit | Attending: Internal Medicine | Admitting: Internal Medicine

## 2022-12-02 DIAGNOSIS — R221 Localized swelling, mass and lump, neck: Secondary | ICD-10-CM | POA: Diagnosis present

## 2022-12-02 MED ORDER — GADOBUTROL 1 MMOL/ML IV SOLN
9.0000 mL | Freq: Once | INTRAVENOUS | Status: AC | PRN
  Administered 2022-12-02: 9 mL via INTRAVENOUS

## 2023-06-25 DEATH — deceased
